# Patient Record
Sex: Male | Born: 1940 | Race: White | Hispanic: No | Marital: Married
Health system: Southern US, Community
[De-identification: ages and names within clinical notes are randomized; demographics above are authoritative.]

---

## 1999-03-01 ENCOUNTER — Encounter (INDEPENDENT_AMBULATORY_CARE_PROVIDER_SITE_OTHER): Payer: Self-pay | Admitting: Specialist

## 1999-03-01 ENCOUNTER — Ambulatory Visit (HOSPITAL_COMMUNITY): Admission: RE | Admit: 1999-03-01 | Discharge: 1999-03-01 | Payer: Self-pay | Admitting: Gastroenterology

## 2000-03-14 ENCOUNTER — Other Ambulatory Visit: Admission: RE | Admit: 2000-03-14 | Discharge: 2000-03-14 | Payer: Self-pay | Admitting: *Deleted

## 2000-03-14 ENCOUNTER — Encounter (INDEPENDENT_AMBULATORY_CARE_PROVIDER_SITE_OTHER): Payer: Self-pay | Admitting: Specialist

## 2002-05-14 ENCOUNTER — Encounter (INDEPENDENT_AMBULATORY_CARE_PROVIDER_SITE_OTHER): Payer: Self-pay | Admitting: Specialist

## 2002-05-14 ENCOUNTER — Ambulatory Visit (HOSPITAL_COMMUNITY): Admission: RE | Admit: 2002-05-14 | Discharge: 2002-05-14 | Payer: Self-pay | Admitting: Gastroenterology

## 2009-04-15 ENCOUNTER — Encounter: Payer: Self-pay | Admitting: Urology

## 2009-04-15 ENCOUNTER — Inpatient Hospital Stay (HOSPITAL_COMMUNITY): Admission: RE | Admit: 2009-04-15 | Discharge: 2009-04-16 | Payer: Self-pay | Admitting: Urology

## 2010-03-05 IMAGING — CR DG CHEST 2V
2 series · 2 of 2 positions shown · non-contrast
Comparison: None

CLINICAL DATA: Prostate cancer, preop

CHEST - 2 VIEW

[w chest pa]
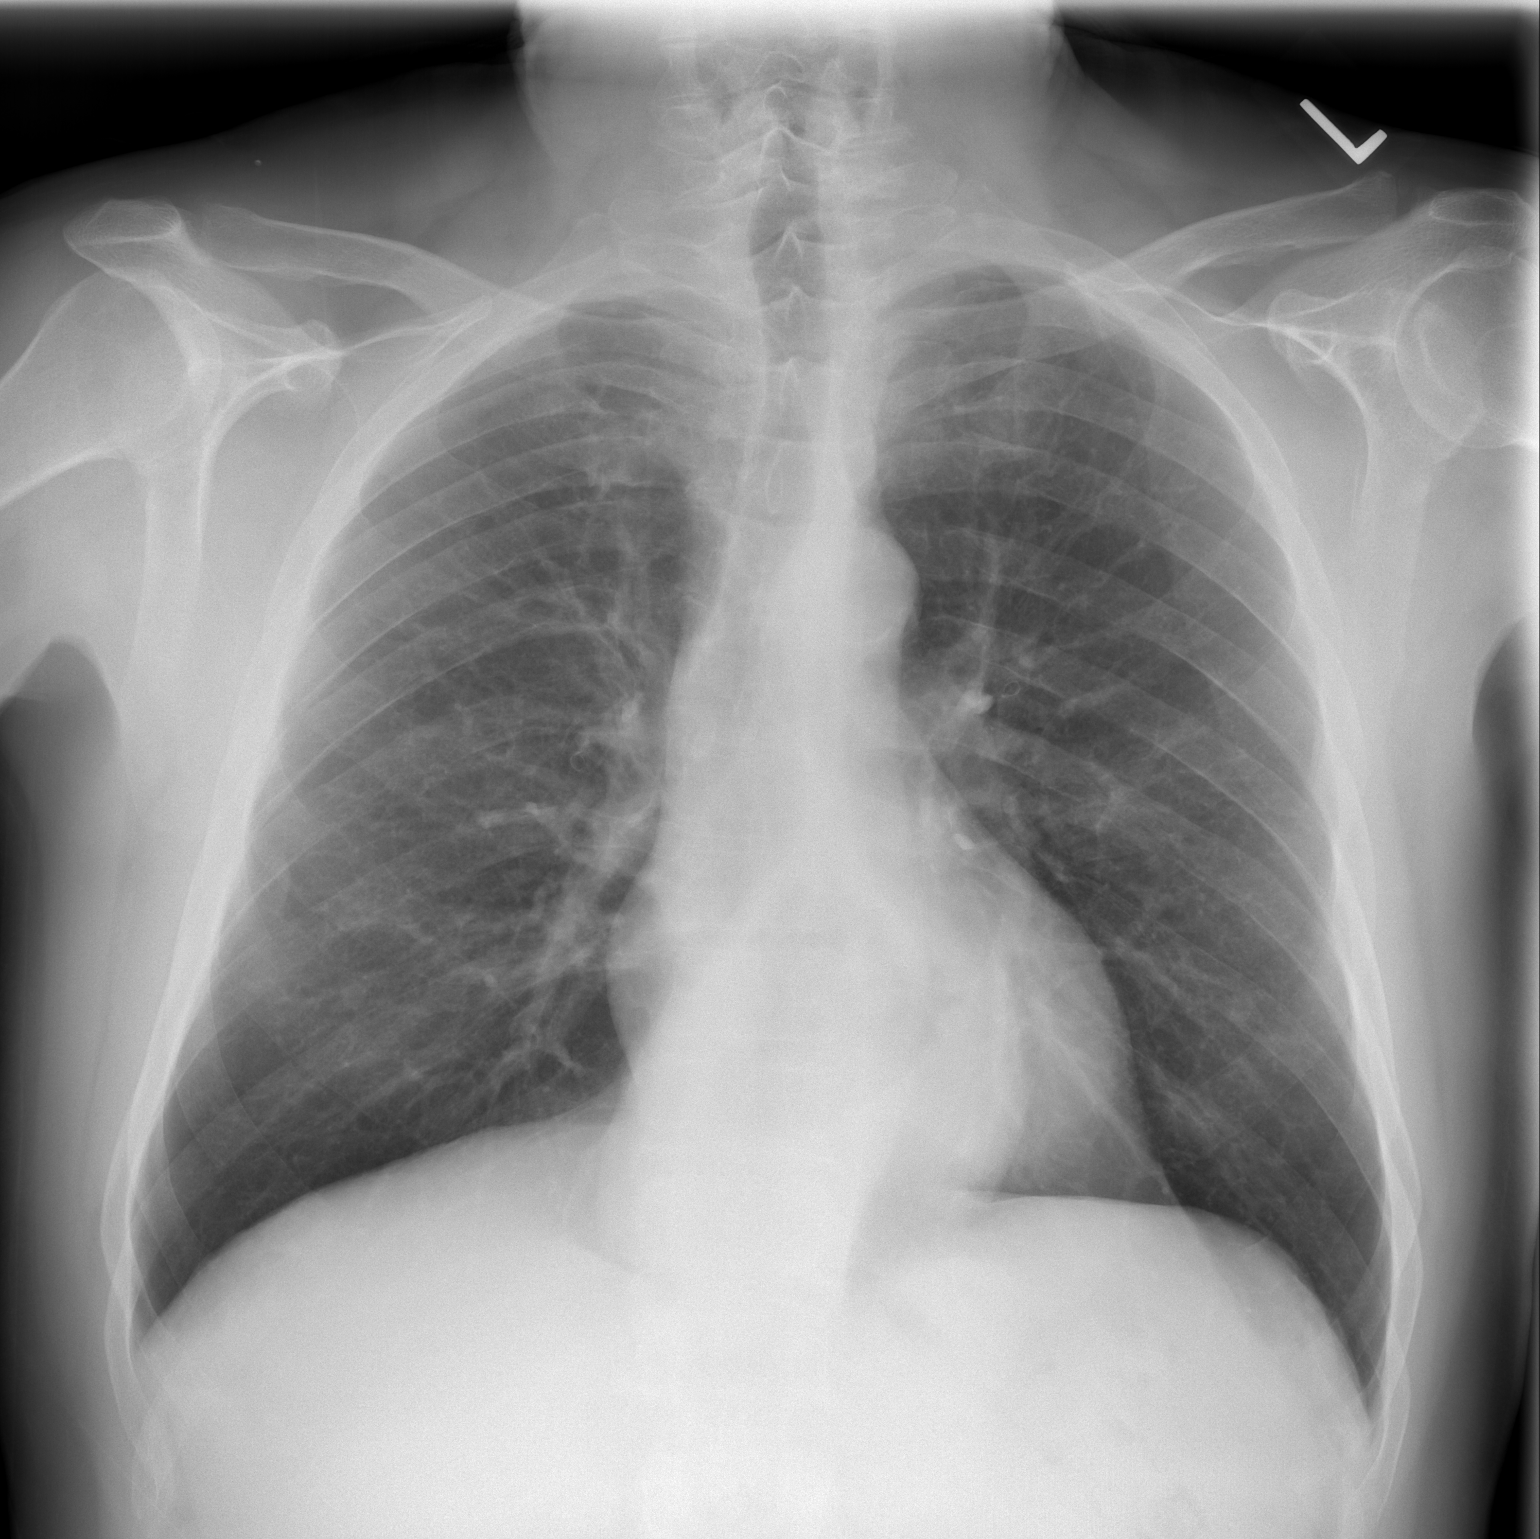

[w chest lat]
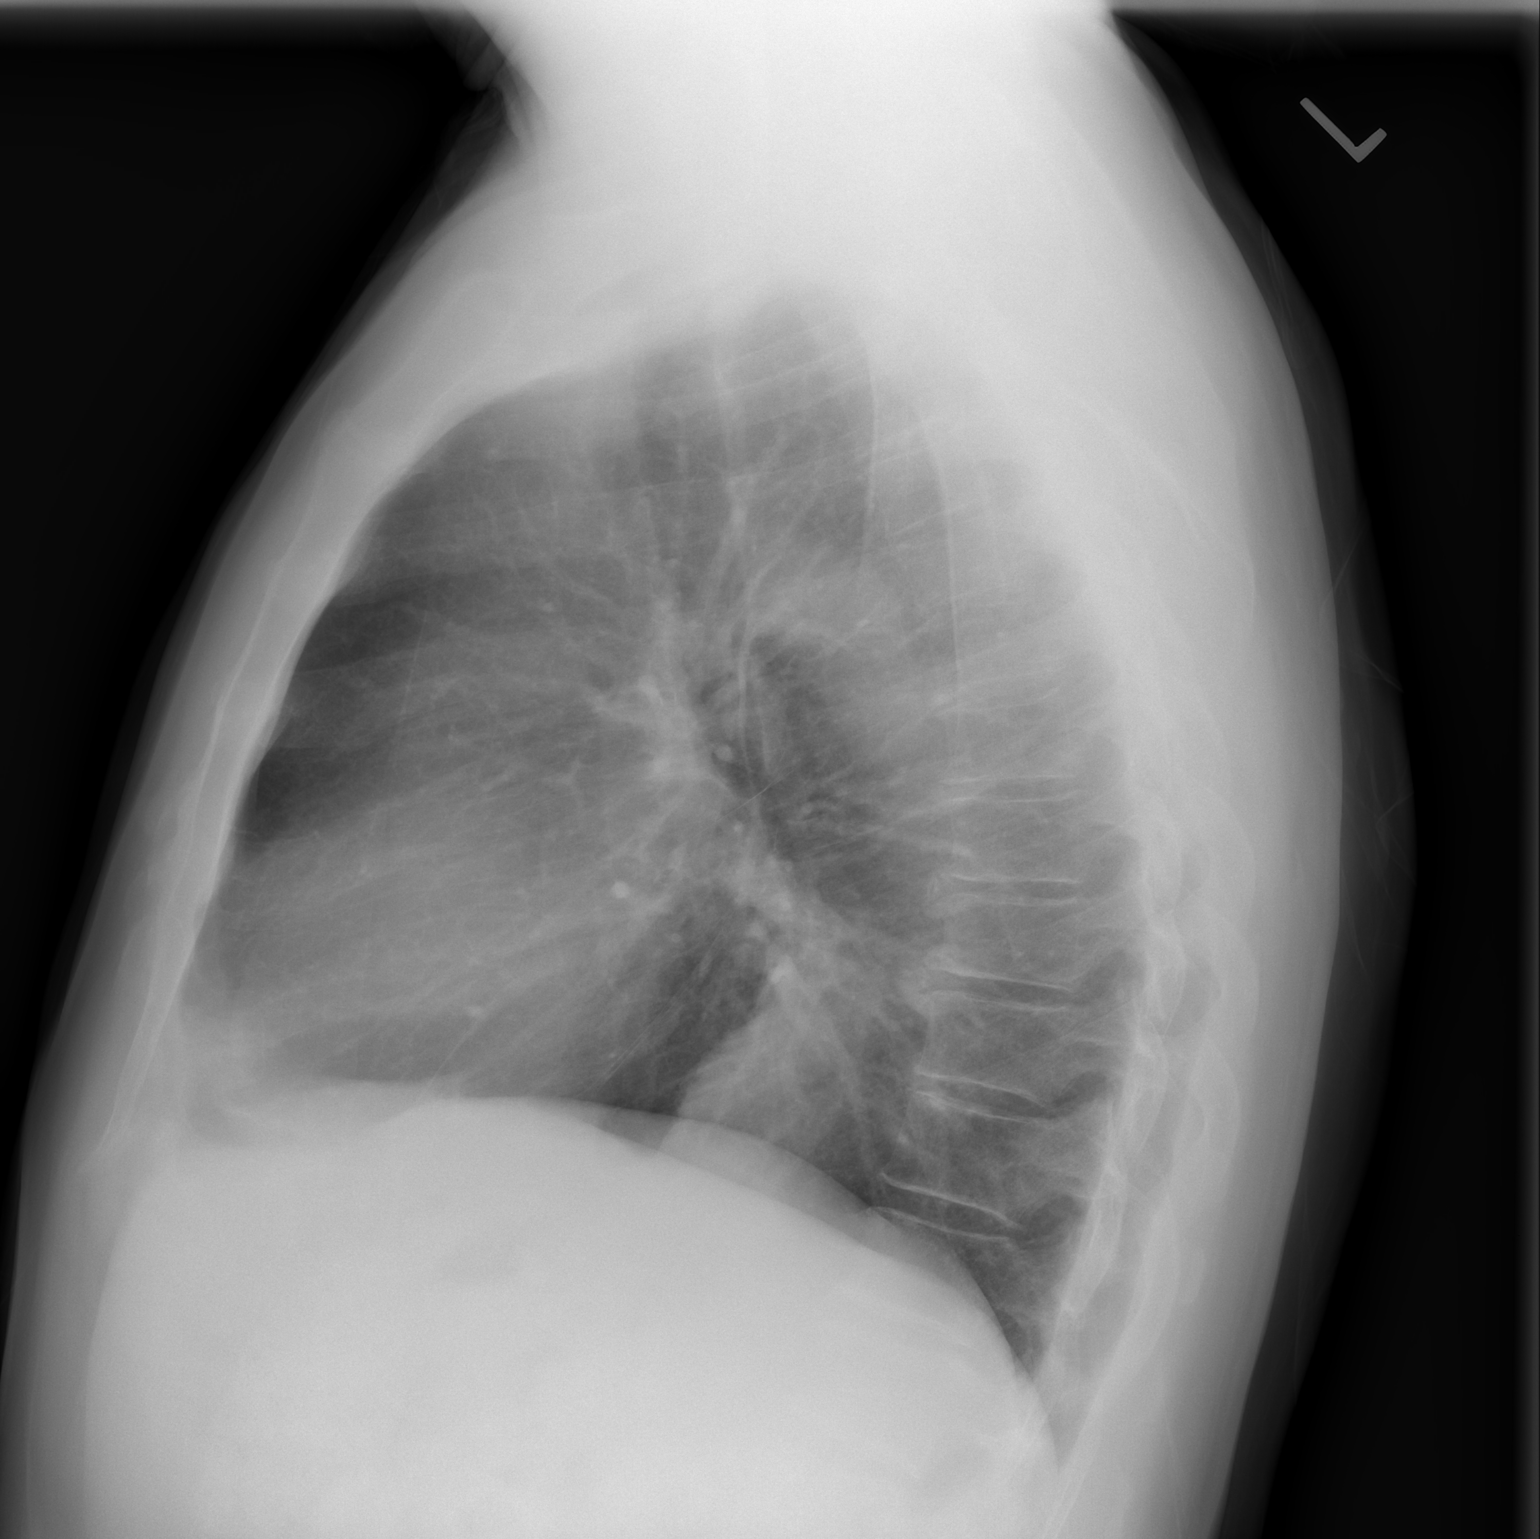

[2 of 2 positions shown; findings below may reference images not displayed]

FINDINGS: Cardiomediastinal silhouette is unremarkable.  No acute
infiltrate or pleural effusion.  No pulmonary edema.  A hiatal
hernia is noted measures 8.9 x 8.4 cm. Mild degenerative changes
thoracic spine.
IMPRESSION: No acute disease.  Hiatal hernia is noted.

## 2010-11-18 LAB — HEMOGLOBIN AND HEMATOCRIT, BLOOD
HCT: 45.1 % (ref 39.0–52.0)
Hemoglobin: 15.4 g/dL (ref 13.0–17.0)

## 2010-11-19 LAB — HEPATIC FUNCTION PANEL
ALT: 28 U/L (ref 0–53)
Alkaline Phosphatase: 86 U/L (ref 39–117)
Bilirubin, Direct: 0.1 mg/dL (ref 0.0–0.3)
Indirect Bilirubin: 0.8 mg/dL (ref 0.3–0.9)
Total Bilirubin: 0.9 mg/dL (ref 0.3–1.2)

## 2010-11-19 LAB — TYPE AND SCREEN
ABO/RH(D): O POS
Antibody Screen: NEGATIVE

## 2010-11-19 LAB — BASIC METABOLIC PANEL
BUN: 17 mg/dL (ref 6–23)
CO2: 24 mEq/L (ref 19–32)
Calcium: 9.4 mg/dL (ref 8.4–10.5)
Chloride: 107 mEq/L (ref 96–112)
Creatinine, Ser: 1.04 mg/dL (ref 0.4–1.5)
GFR calc Af Amer: >60 mL/min (ref >60)

## 2010-11-19 LAB — CBC
MCHC: 34.5 g/dL (ref 30.0–36.0)
MCV: 92.3 fL (ref 78.0–100.0)
Platelets: 157 10*3/uL (ref 150–400)
RBC: 5.03 MIL/uL (ref 4.22–5.81)
RDW: 14.2 % (ref 11.5–15.5)

## 2010-12-27 NOTE — Op Note (Signed)
NAMEMICHAELJAMES, MILNES NO.:  [id number]   MEDICAL RECORD NO.:  [id number]          PATIENT TYPE:  INP   LOCATION:  1439                         FACILITY:  [facility]   PHYSICIAN:  Excell Seltzer. Annabell Howells, M.D.    DATE OF BIRTH:  [birth year]   DATE OF PROCEDURE:  04/15/2009  DATE OF DISCHARGE:                               OPERATIVE REPORT   PREOPERATIVE DIAGNOSIS:  Tumor-1C, Gleason 6 adenocarcinoma of the  prostate.   POSTOPERATIVE DIAGNOSIS:  Tumor-1C, Gleason 6 adenocarcinoma of the  prostate.   OPERATION PERFORMED:  Robot-assisted laparoscopic radical prostatectomy.   SURGEON:  Excell Seltzer. Annabell Howells, M.D.   FIRST ASSISTANT:  Delia Chimes, NP   SECOND ASSISTANT:  Artist Pais. Kathrynn Running, M.D.   ANESTHESIA:  General.   SPECIMEN:  Prostate and seminal vesicles.   DRAINS:  Drains included a #19 Blake wound drain and a 20 [place] coude  Foley catheter.   ESTIMATED BLOOD LOSS:  The estimated blood loss was 250 mL.   COMPLICATIONS:  None.   INDICATIONS:  The patient is a 70 year old white male who was initially  referred for a rising PSA.  He was found on biopsy to have a Gleason 6,  T1C adenocarcinoma of the prostate with relatively low-volume disease;  prostatic volume was 33 mL.  After reviewing the options he has elected  for a radical prostatectomy.   DESCRIPTION OF THE FINDINGS AND OPERATION:  The patient was given the  routine preoperative physical therapy and preoperative teaching as well  as a good bowel prep the night before surgery.  He was brought to the  operating room where he was given a gram of Ancef.  General anesthetic  was induced.  He was fitted with PAS hose and was placed in the low  lithotomy position.  A red rubber rectal catheter was placed and an  Asepto syringe was inserted in the hub.  This was secured to the  patient's left thigh.  His abdomen was clipped.  All pressure points  were padded appropriately.  The red rubber rectal catheter was placed.  As noted above, his abdomen was clipped, he was prepped with Betadine  solution and placed in a steep Trendelenburg position.  The Mayo stand  was placed over his head and was carefully positioned to avoid pressure  on endotracheal tube.  He was then draped in the usual sterile fashion  using towels, laparoscopy drape and an OpSite Barrier drape.  A 22-  French Foley catheter was inserted and the balloon was filled with 22 mL  of sterile fluid.  The catheter was placed to irrigation tubing.   The video port site was marked 18 cm superior to the pubis just to the  left of the umbilicus.  A vertical incision was made approximately 2 cm  in length.  The fat was spread to the fascia using hemostat.  The  anterior rectus fascia was incised with the Bovie and a hemostat was  used to puncture through the peritoneum.  Once the peritoneal cavity had  been entered and confirmed with finger dissection a 12 mm port was  placed and the skin was tightened around the port using a 0-Vicryl  figure-of-eight.  Once the video port was in placed the camera was  inserted and the abdomen was inspected.  No evidence of adhesions was  noted.   At this point the remaining ports were placed in a standard  configuration with two 8-mm robotic ports on the left, an 8-mm robotic  port, a 5-mm suction port and a 12-mm assistant port on the right.  All  port sites, apart from the video port, were infiltrated with local  anesthetic prior to incision.  The ports were all placed under direct  vision.  Once in position and with the abdomen insufflated the robot was  docked.  Cautery scissors was placed in the right robotic arm, a PK  dissector in the left medial and a __________  in the left lateral  robotic ports.   At this point dissection was initiated by incising the peritoneum  anteriorly and dividing the obliterated umbilical arteries.  The bladder  was dissected away from the anterior abdominal wall exposing the   underside of the pubis.  The bladder, which had been filled with 250 mL  of irrigant, was drained.  The anterior prostate was then exposed and  the lateral pelvic fascia was identified.  The anterior prostate was  then defatted.  The right endopelvic fascia was incised and a lateral  plane was developed.  The left endopelvic fascia was then incised and  the lateral plane on the left was developed.  Venous bleeding occurred  on the left and Surgicel was used to reduce the bleeding.  At this point  the puboprostaticae were divided using cautery and sharp dissection, and  the dorsal vein complex was dissected out sufficient to allow placement  of an Endo-GIA stapler, which was placed across the deep dorsal vein  complex, closed and then fired dividing the dorsal vein complex.   Once the dorsal vein complex had been divided we turned our attention to  the bladder neck, which was identified with the aid of the Foley  balloon.  The bladder neck was incised transversely and dissection was  carried down until the Foley catheter was identified.  The Foley was  then brought into the wound and used provide anterior traction.  The  posterior bladder neck was then divided.  The patient received indigo  carmine and blue urine was noted to efflux from the bladder neck.  The  structures were identified.  The left ampulla of the vas was dissected  out to provide anterior traction.  The right ampulla of the vas was then  dissected out and also used to provide anterior traction.  The Foley was  withdrawn at this point.   The right seminal vesicle was dissected out.  He was noted to have some  inflammatory changes of his seminal vesicle and I was unable to dissect  out the entire chip, which was amputated and left in place.  This was  found to be the case on the left as well, thought possibly because of  prior seminal vesiculitis.  Once the structures had been elevated  Denonvilliers' fascia was incised  allowing development of the posterior  plane between the rectum and the prostate.  Once this plane had been  developed apically and laterally, we turned our attention to the nerve  sparing.   On the right the prostatic fascia was incised and the neurovascular  bundle was developed from the bladder neck to  the apex.  This was then  repeated on the left side where there was more adherence of the tissue  and the neurovascular bundle was more difficult to dissect free from the  prostate.  Once the neurovascular bundle dissection had been performed  the prostatic pedicles were taken down using clips.  Once pedicle had  been divided on the right additional lateral attachments were divided  using sharp and blunt dissection up toward the apex.  This was then  repeated on the left.  Once again, the tissues were more adherent on  this side so there was some damage to the neurovascular bundle.  Once  the dissection had progressed as far apically as I felt comfortable I  turned my attention back to the bladder neck.   The residual dorsal vein complex was divided using the cautery.  The  urethra was then divided using the cold scissors.  Once the urethra had  been divided residual apical attachments and rectourethralis attachments  were taken down freeing the prostate from the pelvis.  It was then moved  up into the abdominal cavity for the remainder of the procedure.   At this point the prostatic fossa was irrigated.  The rectal tube was  insufflated without evidence of rectal injury.  The pelvis was then  aspirated using suction.  There was some bleeding noted from posterior  to the urethra, but it was minor.   At this point a 2-0 Vicryl with an SH was used to approximate the cut  edge of Denonvilliers, the posterior bladder neck, the posterior urethra  and rectourethral complex using a slip-knot to reapproximate the bladder  neck and urethral stump.  Once this suture was tightened and tied a   Vicryl and 4-0 Monocryl were then used to perform a standard running  anastomosis.  Once the anastomotic stitch had been completed a fresh 20-  [place] coude Foley catheter was inserted without difficulty, the balloon  was filled with 15 mL of sterile fluid and the catheter was irrigated  with no evidence of leakage from the urethrovesical anastomosis.  The  anastomotic suture was then tied and the bladder was irrigated once  again without evidence of leak.  The suture ends were trimmed.  The  bleeding had largely ceased from the periurethral area, but to be safe 5  mL of FloSeal was applied approximately, 2.5 mL, on each side.   At this point the remainder of the pelvis was inspected and no active  bleeding was noted.  A 19-French Blake drain was placed through the  third arm port and positioned in the pelvis.  The port cannula was  removed and the drain was secured to the skin with a 3-0 nylon suture.  The prostate was returned to the pelvis to allow entrapment and a  entrapment sac.   This point the robot was undocked, the prostate was placed in the  entrapment sac and the 12-mm assistant port was then secured using a 0-  Vicryl and a needle passer.  Once this port had been closed securely the  remaining cannulae were removed under direct vision and video port was  removed.  The umbilical suture was released and the umbilical incision  was extended sufficient to remove the specimen in the entrapment sac.  Once the specimen was removed.  The anterior rectus fascia was closed  using a running 0-Vicryl suture.  The port sites were inspected for  hemostasis.  No active bleeding was identified.  All  port sites were  infiltrated local anesthetic, the OpSite was removed, and the incision  and port sites were closed with skin clips.   The patient had been taken out of the Trendelenburg position for the  removal of the prostate from the abdomen.  At this point the Foley  catheter was  irrigated once again with blue return from the indigo  carmine, and the catheter was placed to straight drainage.  The drapes  were removed.  Dressings were applied.  The patient's anesthetic was  reversed.  He was taken to the recovery room in stable condition.   There were no complications.      Excell Seltzer. Annabell Howells, M.D.  Electronically Signed     JJW/MEDQ  D:  04/15/2009  T:  04/16/2009  Job:  [id number]   cc:   Lemmie Evens, M.D.  Fax: [phone]   Dr. Laural Benes

## 2010-12-30 NOTE — Op Note (Signed)
NAME:  Chris Newton, Chris Newton NO.:  [id number]   MEDICAL RECORD NO.:  [id number]                   PATIENT TYPE:  AMB   LOCATION:  ENDO                                 FACILITY:  [facility]   PHYSICIAN:  Petra Kuba, M.D.                 DATE OF BIRTH:  [birth year]   DATE OF PROCEDURE:  DATE OF DISCHARGE:                                 OPERATIVE REPORT   PROCEDURE PERFORMED:  Esophagogastroduodenoscopy with biopsy.   ENDOSCOPIST:  Petra Kuba, M.D.   INDICATIONS FOR PROCEDURE:  Iron deficiency anemia, upper tract symptoms.  Consent was signed after the risks, benefits, methods and options were  thoroughly discussed in the office.   MEDICINES USED:  Demerol 80 mg, Versed 7 mg.   DESCRIPTION OF PROCEDURE:  The video endoscope was inserted by direct  vision.  The proximal and midesophagus was normal.  In the distal esophagus  was a moderately large  hiatal hernia with a very short segment Barrett's  which was cold biopsied at the end of the procedure.  As the scope was  inserted into the stomach some linear erosions at the  hiatal hernia hiatus  compatible with Sheria Lang lesions were seen.  The scope was inserted into the  antrum, pertinent for a few antral erosions and advanced through a normal  pylorus into the duodenal bulb pertinent for some minimal bulbitis and  around the C-loop to a normal second portion of the duodenum.  Scope was  withdrawn back to the bulb and a good look there ruled out ulcers in that  location.  The scope was withdrawn back to the stomach and retroflexed.  High in the cardia the  hiatal hernia was confirmed.  The angularis, lesser,  greater curve and fundus were all normal on retroflex visualization.  Straight visualization in the stomach was normal except for the antral  findings and  hiatal hernia findings mentioned above.  I went ahead and took  a few biopsies of the antrum and a few of the proximal stomach to rule out  Helicobacter pylori or other abnormalities.  We obtained the Barrett's  biopsies at this juncture, put them in a second container.  The scope was  slowly withdrawn.  No additional esophageal findings were seen.  Scope was  removed.  The patient tolerated the procedure well.  There were no obvious  immediate complication.   ENDOSCOPIC DIAGNOSIS:  1. Moderately large  hiatal hernia with some Cameron lesions.  2. 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  3. Mild antritis and antral erosions, status post biopsy.  4. Minimal bulbitis.  5. Otherwise normal esophagogastroduodenoscopy.   PLAN:  1. Continue pump inhibitors, await pathology to determine future endoscopic     screening.  Will probably feel more comfortable if we recheck the colon     since it has been three years and he has family history of colon cancer.  He     would prefer to hold off, so I will see him back in six weeks to     rediscuss that and consider repeating guaiacs and CBC, to help decide how     to proceed, would proceed sooner p.r.n.  In the mean time will restart     his iron.  Warned him about being careful about aspirin and     nonsteroidals.                                               Petra Kuba, M.D.    MEM/MEDQ  D:  05/14/2002  T:  05/15/2002  Job:  [id number]   cc:   [facility]

## 2012-11-28 ENCOUNTER — Other Ambulatory Visit: Payer: Self-pay | Admitting: Gastroenterology

## 2015-02-02 ENCOUNTER — Other Ambulatory Visit: Payer: Self-pay | Admitting: Gastroenterology

## 2019-09-12 ENCOUNTER — Ambulatory Visit: Payer: Self-pay

## 2019-09-18 ENCOUNTER — Ambulatory Visit: Payer: Medicare Other | Attending: Internal Medicine

## 2019-09-18 DIAGNOSIS — Z23 Encounter for immunization: Secondary | ICD-10-CM

## 2019-09-18 NOTE — Progress Notes (Signed)
   Covid-19 Vaccination Clinic  Name:  Chris Newton    MRN: [id number] DOB: Jan 28, 1941  09/18/2019  Mr. Chris Newton was observed post Covid-19 immunization for 15 minutes without incidence. He was provided with Vaccine Information Sheet and instruction to access the V-Safe system.   Mr. Chris Newton was instructed to call 911 with any severe reactions post vaccine: Marland Kitchen Difficulty breathing  . Swelling of your face and throat  . A fast heartbeat  . A bad rash all over your body  . Dizziness and weakness    Immunizations Administered    Name Date Dose VIS Date Route   Pfizer COVID-19 Vaccine 09/18/2019  1:13 PM 0.3 mL 07/25/2019 Intramuscular   Manufacturer: [employer], [employer]   Lot: [id number]   NDC: [address]-0

## 2019-09-29 ENCOUNTER — Ambulatory Visit: Payer: Self-pay

## 2019-10-13 ENCOUNTER — Ambulatory Visit: Payer: Medicare Other | Attending: Internal Medicine

## 2019-10-13 DIAGNOSIS — Z23 Encounter for immunization: Secondary | ICD-10-CM | POA: Insufficient documentation

## 2019-10-13 NOTE — Progress Notes (Signed)
   Covid-19 Vaccination Clinic  Name:  JONATHAN CORPUS    MRN: [id number] DOB: [birth year]  10/13/2019  Mr. Tissue was observed post Covid-19 immunization for 15 minutes without incidence. He was provided with Vaccine Information Sheet and instruction to access the V-Safe system.   Mr. Grunewald was instructed to call 911 with any severe reactions post vaccine: Marland Kitchen Difficulty breathing  . Swelling of your face and throat  . A fast heartbeat  . A bad rash all over your body  . Dizziness and weakness    Immunizations Administered    Name Date Dose VIS Date Route   Pfizer COVID-19 Vaccine 10/13/2019  3:59 PM 0.3 mL 07/25/2019 Intramuscular   Manufacturer: [employer], [employer]   Lot: [id number]   NDC: [address]-4
